# Patient Record
Sex: Female | Born: 1985 | Race: Black or African American | Hispanic: No | Marital: Single | Smoking: Never smoker
Health system: Southern US, Community
[De-identification: ages and names within clinical notes are randomized; demographics above are authoritative.]

---
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
## 2017-05-05 ENCOUNTER — Ambulatory Visit (HOSPITAL_COMMUNITY)
Admission: EM | Admit: 2017-05-05 | Discharge: 2017-05-05 | Disposition: A | Discharge status: Home / Self Care | Payer: Medicaid Other | Attending: Family Medicine | Admitting: Family Medicine

## 2017-05-05 ENCOUNTER — Ambulatory Visit (INDEPENDENT_AMBULATORY_CARE_PROVIDER_SITE_OTHER): Payer: Medicaid Other

## 2017-05-05 ENCOUNTER — Encounter (HOSPITAL_COMMUNITY): Payer: Self-pay | Admitting: Emergency Medicine

## 2017-05-05 DIAGNOSIS — R1011 Right upper quadrant pain: Secondary | ICD-10-CM

## 2017-05-05 DIAGNOSIS — Z3202 Encounter for pregnancy test, result negative: Secondary | ICD-10-CM | POA: Diagnosis not present

## 2017-05-05 DIAGNOSIS — R1013 Epigastric pain: Secondary | ICD-10-CM

## 2017-05-05 LAB — POCT PREGNANCY, URINE: PREG TEST UR: NEGATIVE

## 2017-05-05 NOTE — ED Notes (Signed)
Unable to obtain blood sample x 2 due to poor venous access.

## 2017-05-05 NOTE — ED Provider Notes (Signed)
CSN: [id number]     Arrival date & time 05/05/17  1000 History   First MD Initiated Contact with Patient 05/05/17 1035     Chief Complaint  Patient presents with  . Abdominal Pain   (Consider location/radiation/quality/duration/timing/severity/associated sxs/prior Treatment) 31 year old female, obese complaining of right upper quadrant pain for one year. Pain is intermittent. It is exacerbated by eating certain foods and particularly fatty foods. 3 days ago she was experiencing right upper quadrant pain associated with weakness and nausea and vomiting once while at work and today she had an episode of weakness at work and was told to go see a doctor. Denies fevers or chills. Denies syncope, additional vomiting since 3 days ago. She did not seek medical care for her pain or other symptoms in the past year. She does have Medicaid. Has a family history of cholestatic disease and cholecystectomies.      History reviewed. No pertinent past medical history. Past Surgical History:  Procedure Laterality Date  . CESAREAN SECTION     No family history on file. Social History  Substance Use Topics  . Smoking status: Never Smoker  . Smokeless tobacco: Not on file  . Alcohol use Yes   OB History    No data available     Review of Systems  Constitutional: Positive for activity change. Negative for fever.  HENT: Negative.   Respiratory: Negative.  Negative for cough and shortness of breath.   Cardiovascular: Negative.   Gastrointestinal: Positive for abdominal pain and nausea. Negative for blood in stool, constipation and diarrhea.       As per history of present illness She does note that in addition to the right upper quadrant pain pain radiates to the epigastrium.  Genitourinary: Negative.   Skin: Negative.   All other systems reviewed and are negative.   Allergies  Patient has no known allergies.  Home Medications   Prior to Admission medications   Medication Sig Start Date End  Date Taking? Authorizing Provider  Naproxen Sodium (ALEVE PO) Take by mouth.   Yes [provider]   Meds Ordered and Administered this Visit  Medications - No data to display  BP 115/68 (BP Location: Right Arm)   Pulse 70   Temp 98.2 F (36.8 C) (Oral)   Resp 18   LMP 05/01/2017   SpO2 100%  Orthostatic VS for the past 24 hrs:  BP- Lying Pulse- Lying BP- Sitting Pulse- Sitting BP- Standing at 0 minutes Pulse- Standing at 0 minutes  05/05/17 1056 109/74 68 110/68 64 108/65 69    Physical Exam  Constitutional: She is oriented to person, place, and time. She appears well-developed and well-nourished. No distress.  Neck: Normal range of motion. Neck supple.  Cardiovascular: Normal rate, regular rhythm and normal heart sounds.   Pulmonary/Chest: Effort normal and breath sounds normal. No respiratory distress. She has no wheezes.  Abdominal: Soft. Bowel sounds are normal.  Minor tenderness to the right upper quadrant and epigastrium. She states it is very mild. She notes that this is the location of most of her discomfort when she does have it. No tenderness to the remainder of the abdomen. There is areas of percussive dullness and tympany. No rebound or guarding. No masses.  Neurological: She is alert and oriented to person, place, and time.  Skin: Skin is warm and dry.  Psychiatric: She has a normal mood and affect.  Nursing note and vitals reviewed.   Urgent Care Course  Procedures (including critical care time)  Labs Review Labs Reviewed  POCT PREGNANCY, URINE    Imaging Review Dg Abd 1 View  Result Date: 05/05/2017 CLINICAL DATA:  Upper abdominal pain x1 year, intermittent EXAM: ABDOMEN - 1 VIEW COMPARISON:  None. FINDINGS: Nonobstructive bowel gas pattern. Visualized osseous structures are within normal limits. IMPRESSION: Unremarkable abdominal radiograph. Electronically Signed   By: Charline Bills M.D.   On: 05/05/2017 11:28     Visual Acuity  Review  Right Eye Distance:   Left Eye Distance:   Bilateral Distance:    Right Eye Near:   Left Eye Near:    Bilateral Near:         MDM   1. RUQ pain   2. Epigastric pain    He will need to follow-up with a primary care provider to obtain test for your abdominal pain. Community health and wellness is listed on this page. You may also call a family practice provider or internal medicine. You will need to have tests such as ultrasound, lab work. If before you are able to see a primary care provider and have increased pain, vomiting, fever or chills or worsening go directly to the emergency department. Avoid all fatty and greasy foods.     Hayden Rasmussen, NP 05/05/17 1154

## 2017-05-05 NOTE — ED Triage Notes (Signed)
Right upper epigastric pain radiating to the back.  This pain has been experienced for a year.  Patient has had dizziness Friday and then again today.  Patient has had episodes of nausea, no diarrhea.

## 2017-05-05 NOTE — Discharge Instructions (Signed)
He will need to follow-up with a primary care provider to obtain test for your abdominal pain. Community health and wellness is listed on this page. You may also call a family practice provider or internal medicine. You will need to have tests such as ultrasound, lab work. If before you are able to see a primary care provider and have increased pain, vomiting, fever or chills or worsening go directly to the emergency department. Avoid all fatty and greasy foods.

## 2018-04-29 IMAGING — DX DG ABDOMEN 1V
2 series · 2 of 2 positions shown · non-contrast
Comparison: None.

CLINICAL DATA: Upper abdominal pain x1 year, intermittent

EXAM:
ABDOMEN - 1 VIEW

[abdomen kub (1 of 2)]
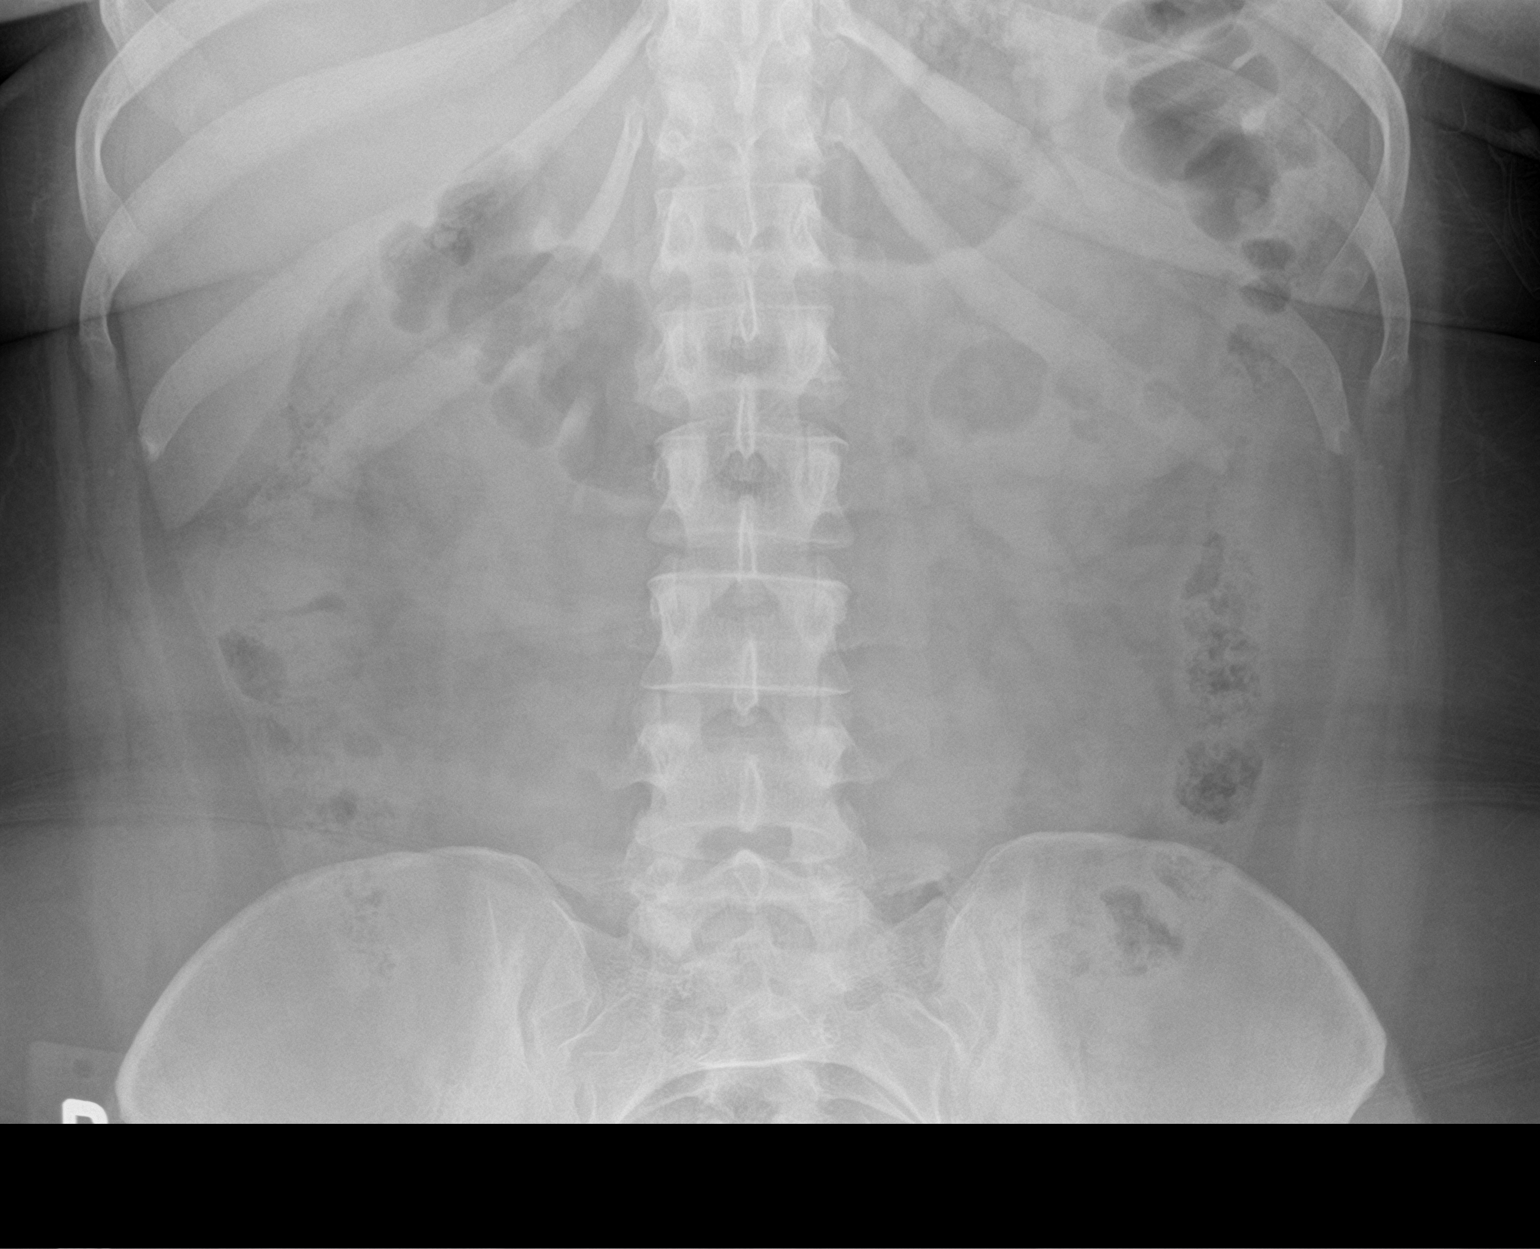

[abdomen kub (2 of 2)]
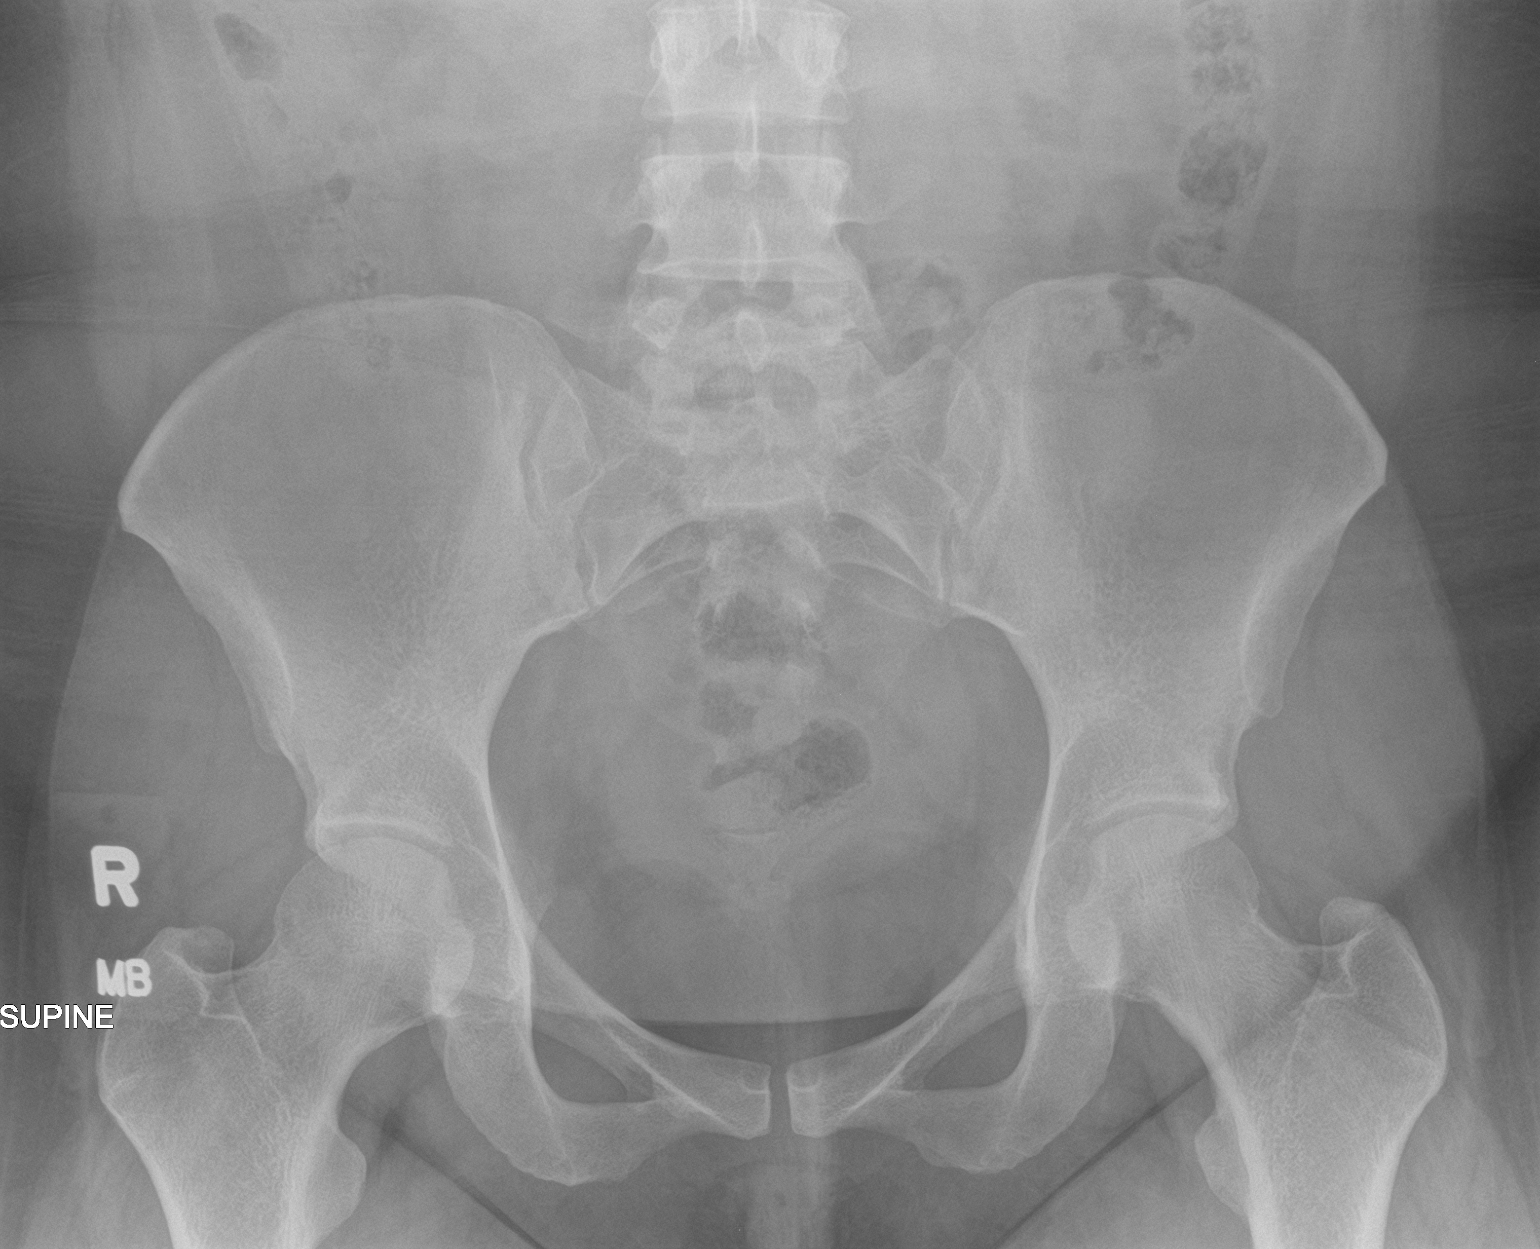

[2 of 2 positions shown; findings below may reference images not displayed]

FINDINGS: Nonobstructive bowel gas pattern.

Visualized osseous structures are within normal limits.
IMPRESSION: Unremarkable abdominal radiograph.
